# Patient Record
Sex: Male | Born: 1960 | Race: Black or African American | Hispanic: No | Marital: Single | Smoking: Former smoker
Health system: Southern US, Community
[De-identification: ages and names within clinical notes are randomized; demographics above are authoritative.]

## PROBLEM LIST (undated history)

## (undated) DIAGNOSIS — I10 Essential (primary) hypertension: Secondary | ICD-10-CM

## (undated) HISTORY — PX: BACK SURGERY: SHX140

---

## 2005-06-06 ENCOUNTER — Ambulatory Visit: Payer: Self-pay | Admitting: Internal Medicine

## 2007-05-18 ENCOUNTER — Emergency Department: Payer: Self-pay | Admitting: Emergency Medicine

## 2007-05-26 ENCOUNTER — Emergency Department: Payer: Self-pay | Admitting: Emergency Medicine

## 2009-11-11 ENCOUNTER — Emergency Department: Payer: Self-pay | Admitting: Emergency Medicine

## 2009-11-19 ENCOUNTER — Emergency Department: Payer: Self-pay | Admitting: Emergency Medicine

## 2010-08-17 ENCOUNTER — Emergency Department: Payer: Self-pay | Admitting: Emergency Medicine

## 2011-12-07 IMAGING — CR DG ABDOMEN 1V
1 series · 2 of 2 positions shown · non-contrast
Comparison: none

REASON FOR EXAM: constipation
COMMENTS:

PROCEDURE:     DXR - DXR KIDNEY URETER BLADDER  - August 17, 2010 [DATE]
RESULT:     There is no previous exam for comparison.
The bowel gas pattern is unremarkable. No radiopaque renal calculi are seen.
The bony structures are unremarkable.

[Series 1: view not recorded · 0.17mm/px · 2 of 2 slices shown]
[im 1/2]
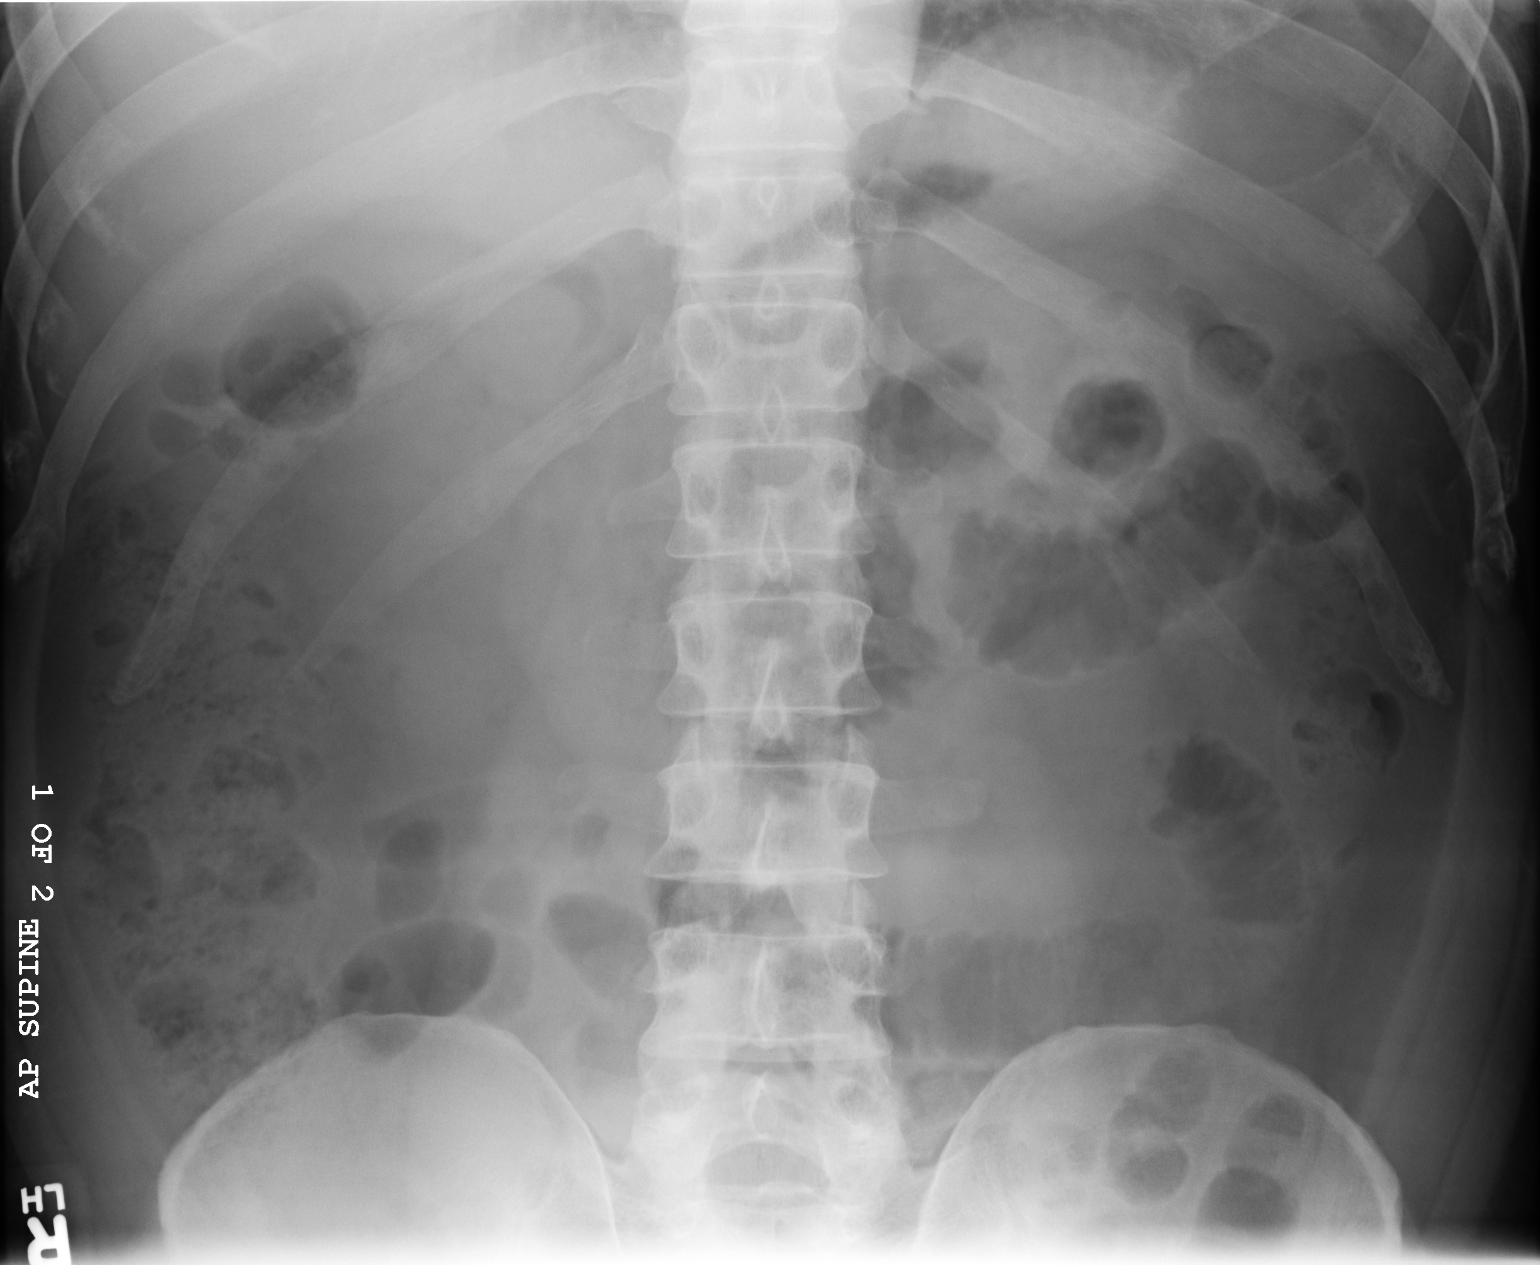
[im 2/2]
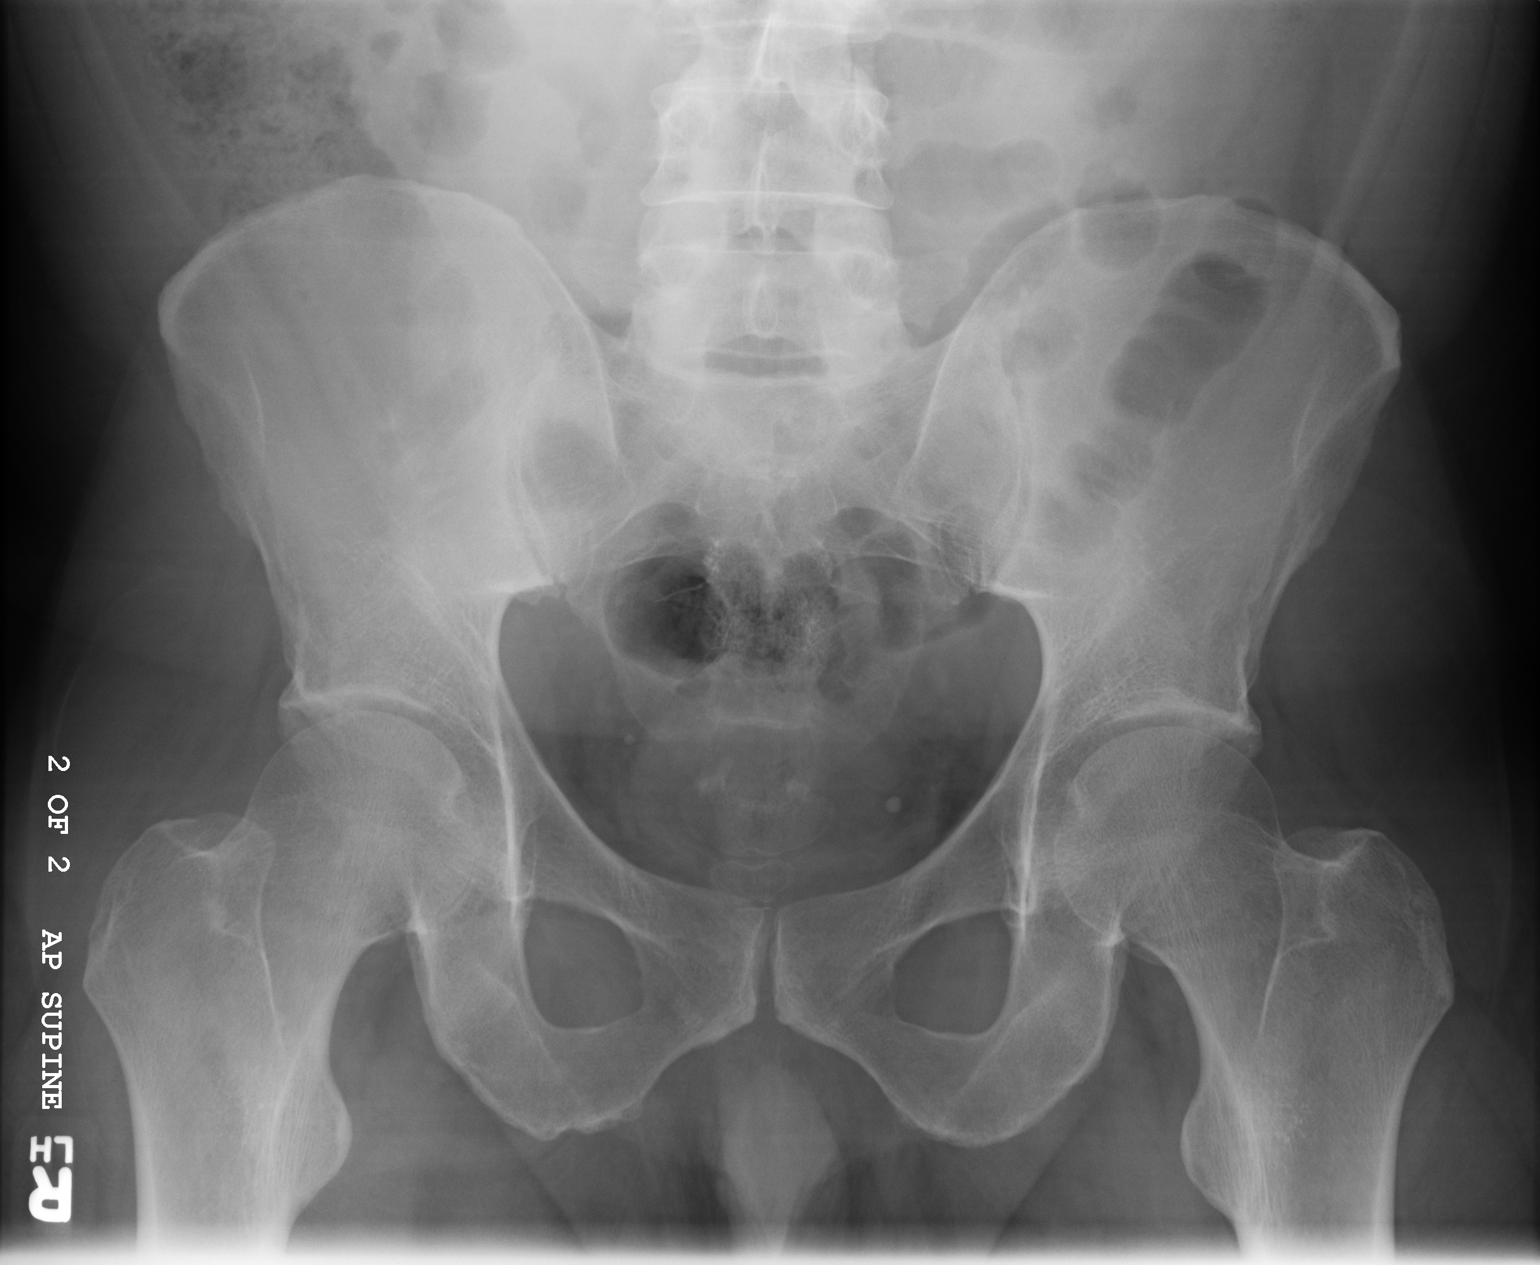

[2 of 2 positions shown; findings below may reference images not displayed]

IMPRESSION: No evidence of bowel obstruction or perforation.

## 2011-12-07 IMAGING — CR DG CHEST 2V
1 series · 2 of 2 positions shown · non-contrast
Comparison: none

REASON FOR EXAM: Shortness of Breath
COMMENTS:   May transport without cardiac monitor

[Series 1: view not recorded · 0.17mm/px · 2 of 2 slices shown]
[im 1/2]
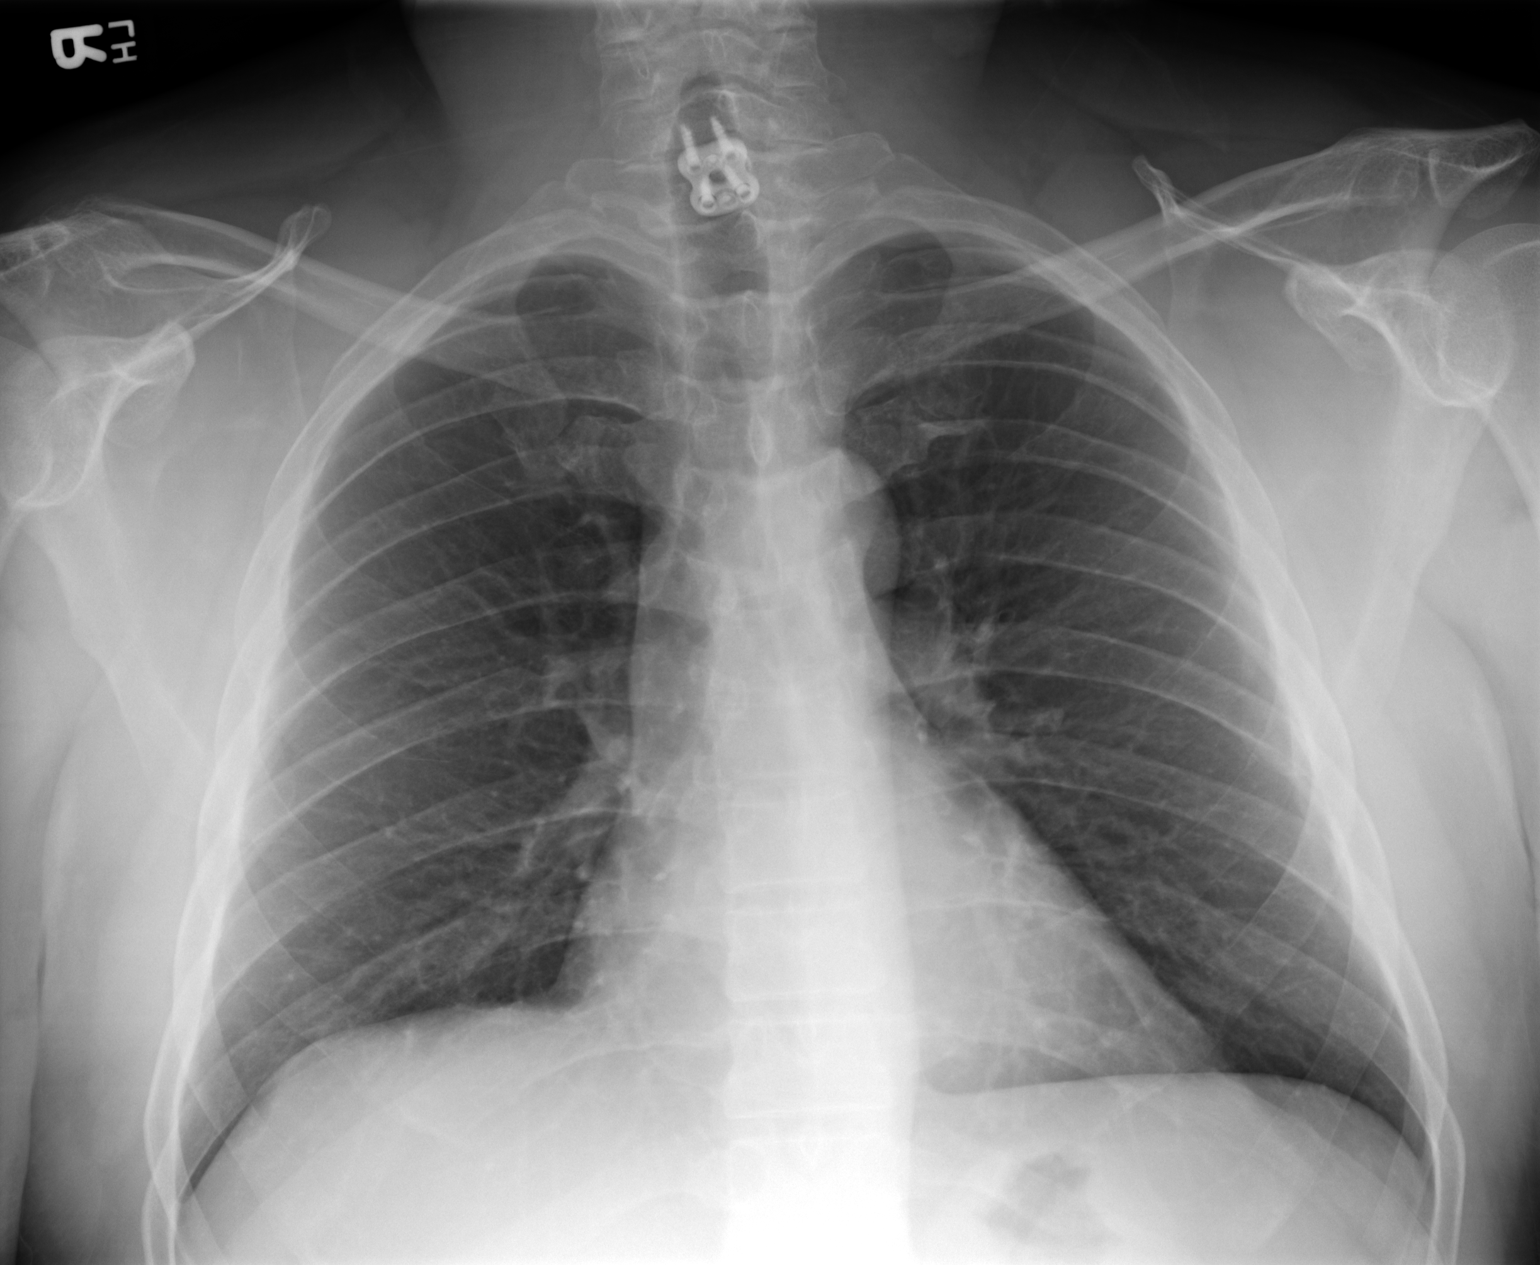
[im 2/2]
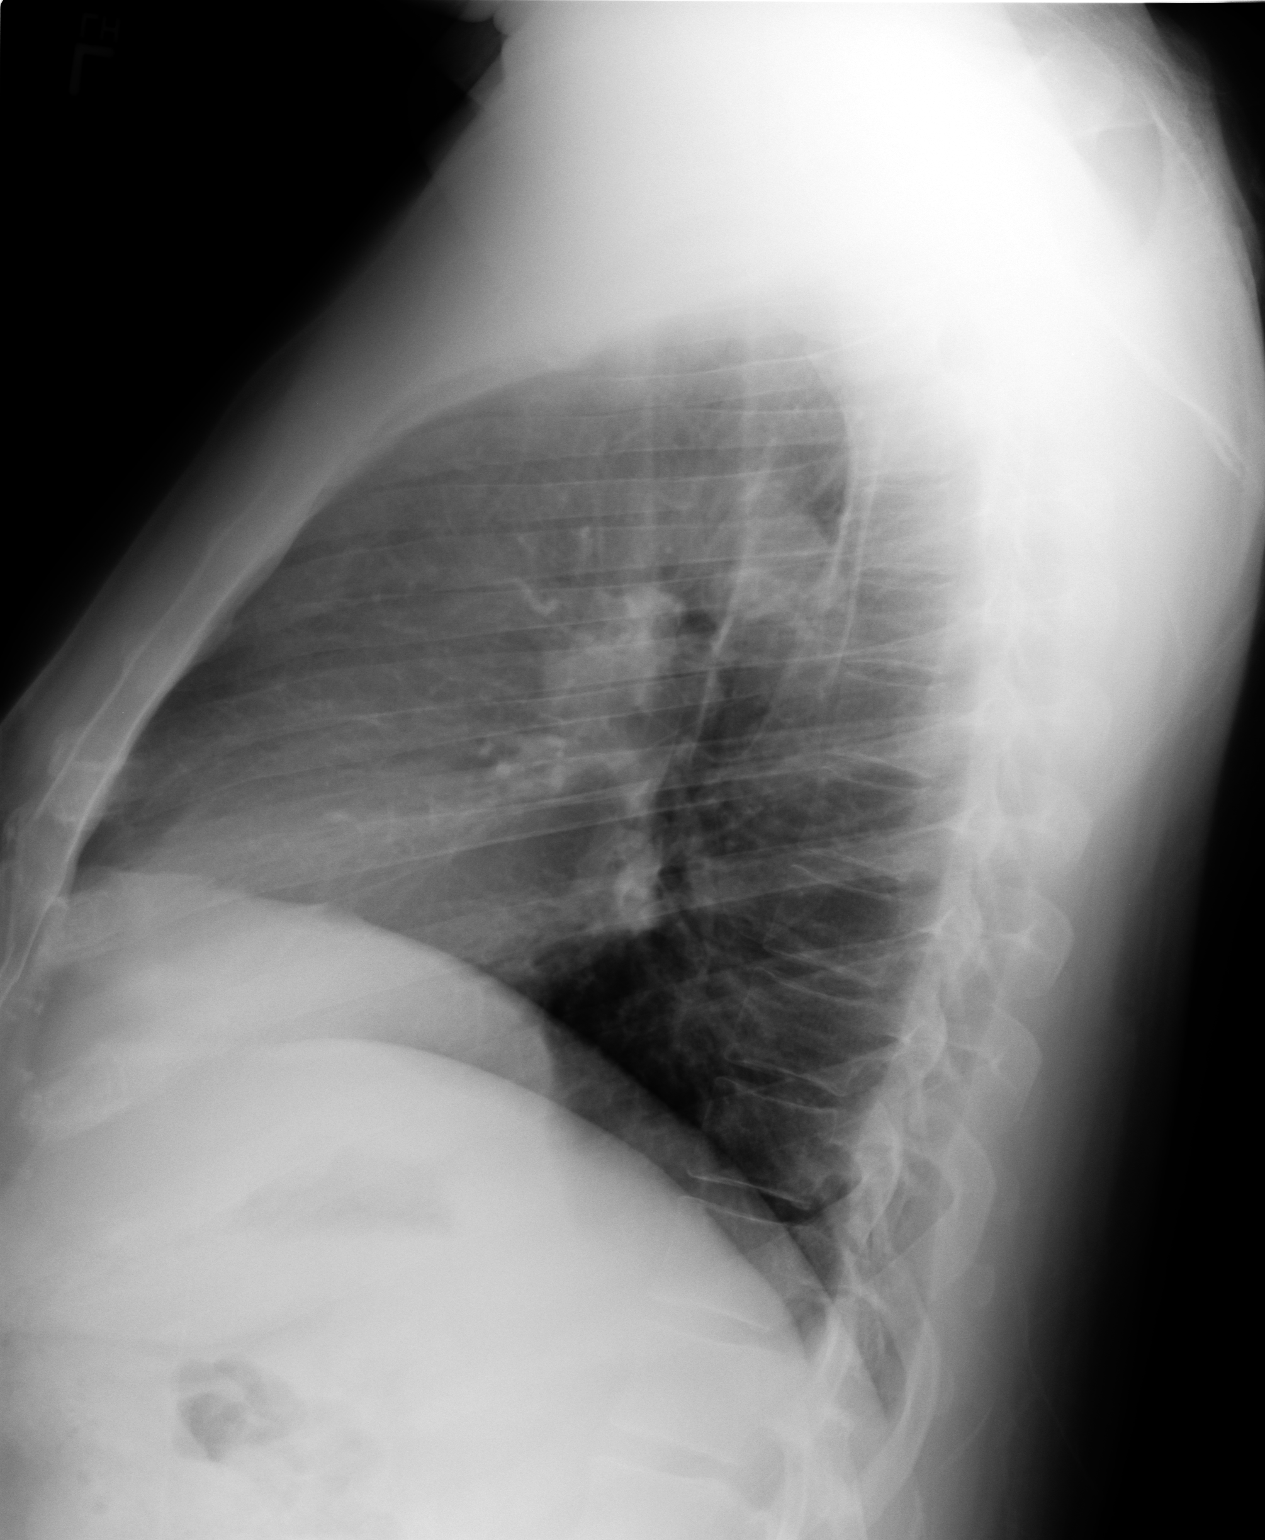

[2 of 2 positions shown; findings below may reference images not displayed]

PROCEDURE:     DXR - DXR CHEST PA (OR AP) AND LATERAL  - August 17, 2010  [DATE]

RESULT:     There is no previous study for comparison.

The lungs are clear. The heart and pulmonary vessels are normal. The bony
and mediastinal structures are unremarkable. There is no effusion. There is
no pneumothorax or evidence of congestive failure.
IMPRESSION: No acute cardiopulmonary disease.

## 2016-06-13 ENCOUNTER — Other Ambulatory Visit: Payer: Self-pay | Admitting: Family Medicine

## 2016-06-13 DIAGNOSIS — M5410 Radiculopathy, site unspecified: Secondary | ICD-10-CM

## 2016-07-02 ENCOUNTER — Ambulatory Visit
Admission: RE | Admit: 2016-07-02 | Discharge: 2016-07-02 | Disposition: A | Discharge status: Home / Self Care | Payer: Self-pay | Source: Ambulatory Visit | Attending: Family Medicine | Admitting: Family Medicine

## 2016-07-02 DIAGNOSIS — M48061 Spinal stenosis, lumbar region without neurogenic claudication: Secondary | ICD-10-CM | POA: Insufficient documentation

## 2016-07-02 DIAGNOSIS — R609 Edema, unspecified: Secondary | ICD-10-CM | POA: Insufficient documentation

## 2016-07-02 DIAGNOSIS — M545 Low back pain: Secondary | ICD-10-CM | POA: Insufficient documentation

## 2016-07-02 DIAGNOSIS — M5410 Radiculopathy, site unspecified: Secondary | ICD-10-CM

## 2016-08-05 ENCOUNTER — Emergency Department
Admission: EM | Admit: 2016-08-05 | Discharge: 2016-08-05 | Disposition: A | Discharge status: Home / Self Care | Payer: Self-pay | Attending: Emergency Medicine | Admitting: Emergency Medicine

## 2016-08-05 ENCOUNTER — Encounter: Payer: Self-pay | Admitting: Emergency Medicine

## 2016-08-05 DIAGNOSIS — I10 Essential (primary) hypertension: Secondary | ICD-10-CM | POA: Insufficient documentation

## 2016-08-05 DIAGNOSIS — L259 Unspecified contact dermatitis, unspecified cause: Secondary | ICD-10-CM | POA: Insufficient documentation

## 2016-08-05 DIAGNOSIS — Z87891 Personal history of nicotine dependence: Secondary | ICD-10-CM | POA: Insufficient documentation

## 2016-08-05 HISTORY — DX: Essential (primary) hypertension: I10

## 2016-08-05 MED ORDER — DIPHENHYDRAMINE HCL 25 MG PO CAPS
50.0000 mg | ORAL_CAPSULE | Freq: Once | ORAL | Status: AC
  Administered 2016-08-05: 50 mg via ORAL
  Filled 2016-08-05: qty 2

## 2016-08-05 MED ORDER — PREDNISONE 20 MG PO TABS: 40.0000 mg | ORAL_TABLET | Freq: Every day | ORAL | 0 refills | Status: AC

## 2016-08-05 MED ORDER — PREDNISONE 20 MG PO TABS
60.0000 mg | ORAL_TABLET | Freq: Once | ORAL | Status: AC
  Administered 2016-08-05: 60 mg via ORAL
  Filled 2016-08-05: qty 3

## 2016-08-05 NOTE — ED Provider Notes (Signed)
[facility] Emergency Department Provider Note   ____________________________________________   I have reviewed the triage vital signs and the nursing notes.   HISTORY  Chief Complaint Facial Swelling   History limited by: Not Limited   HPI Carlos Maynard is a 55 y.o. male who presents to the emergency department today because of concerns for facial swelling. This started last night. The patient states that he had put in just for men hair product in his beard. Shortly afterwards he started noticing swelling discomfort to the beard area. He shaved his beard and has been trying rubbing alcohol. He feels like it is swollen. He states he had a similar episode occur when he tried just for men hair product on his scalp a few years ago.   Past Medical History:  Diagnosis Date  . Hypertension     There are no active problems to display for this patient.   Past Surgical History:  Procedure Laterality Date  . BACK SURGERY      Prior to Admission medications   Not on File    Allergies Patient has no known allergies.  No family history on file.  Social History Social History  Substance Use Topics  . Smoking status: Former Games developermoker  . Smokeless tobacco: Not on file  . Alcohol use Not on file    Review of Systems  Constitutional: Negative for fever. Cardiovascular: Negative for chest pain. Respiratory: Negative for shortness of breath. Gastrointestinal: Negative for abdominal pain, vomiting and diarrhea. Genitourinary: Negative for dysuria. Musculoskeletal: Negative for back pain. Skin: Positive for swelling, discomfort to beard area Neurological: Negative for headaches, focal weakness or numbness.  10-point ROS otherwise negative.  ____________________________________________   PHYSICAL EXAM:  VITAL SIGNS: ED Triage Vitals  Enc Vitals Group     BP 08/05/16 1412 (!) 156/74     Pulse Rate 08/05/16 1412 72     Resp 08/05/16 1412 18   Temp 08/05/16 1412 98.2 F (36.8 C)     Temp Source 08/05/16 1412 Oral     SpO2 08/05/16 1412 97 %     Weight 08/05/16 1415 250 lb (113.4 kg)     Height 08/05/16 1415 6\' 1"  (1.854 m)     Head Circumference --      Peak Flow --      Pain Score --      Pain Loc --      Pain Edu? --      Excl. in GC? --    Constitutional: Alert and oriented. Well appearing and in no distress. Eyes: Conjunctivae are normal. Normal extraocular movements. ENT   Head: Normocephalic   Nose: No congestion/rhinnorhea.   Mouth/Throat: Mucous membranes are moist. No tongue swelling. Submandibular space soft.   Neck: No stridor. Hematological/Lymphatic/Immunilogical: No cervical lymphadenopathy. Cardiovascular: Normal rate, regular rhythm.  No murmurs, rubs, or gallops.  Respiratory: Normal respiratory effort without tachypnea nor retractions. Breath sounds are clear and equal bilaterally. No wheezes/rales/rhonchi. Musculoskeletal: Normal range of motion in all extremities. No lower extremity edema. Neurologic:  Normal speech and language. No gross focal neurologic deficits are appreciated.  Skin:  Beard area of face with some swelling, mild erythema Psychiatric: Mood and affect are normal. Speech and behavior are normal. Patient exhibits appropriate insight and judgment.  ____________________________________________    LABS (pertinent positives/negatives)  None  ____________________________________________   EKG  None  ____________________________________________    RADIOLOGY  None  ____________________________________________   PROCEDURES  Procedures  ____________________________________________   INITIAL  IMPRESSION / ASSESSMENT AND PLAN / ED COURSE  Pertinent labs & imaging results that were available during my care of the patient were reviewed by me and considered in my medical decision making (see chart for details).  Patient presents to the emergency department  today because of concerns for swelling around his beard ear after using just for men hair products. It sounds like patient had a similar reaction a couple of years ago when he isn't on the skull. This point I do think it is likely contact dermatitis secondary to the just for men hair product. Will plan on discharging with steroids and instructed patient on using Benadryl.  ____________________________________________   FINAL CLINICAL IMPRESSION(S) / ED DIAGNOSES  Final diagnoses:  Contact dermatitis, unspecified contact dermatitis type, unspecified trigger     Note: This dictation was prepared with Dragon dictation. Any transcriptional errors that result from this process are unintentional     Phineas SemenGraydon Barbette Mcglaun, MD 08/05/16 1456

## 2016-08-05 NOTE — ED Notes (Signed)
AAOx3.  Skin warm and dry.  NAD 

## 2016-08-05 NOTE — Discharge Instructions (Signed)
Please seek medical attention for any high fevers, chest pain, shortness of breath, change in behavior, persistent vomiting, bloody stool or any other new or concerning symptoms.  

## 2016-08-05 NOTE — ED Triage Notes (Signed)
States yesterday he put Just for Men hair color on beard. After face began to swell. Shaved beard off. Facial swelling noted. States he has been putting rubbing alcohol on it to treat it.

## 2017-10-22 IMAGING — MR MR LUMBAR SPINE W/O CM
4 of 5 series · 15 of 48 positions shown · non-contrast
Comparison: None.

CLINICAL DATA: 55-year-old male with acute low back pain with
radicular symptoms less than 6 weeks. Initial encounter.

EXAM:
MRI LUMBAR SPINE WITHOUT CONTRAST
TECHNIQUE: Multiplanar, multisequence MR imaging of the lumbar spine was
performed. No intravenous contrast was administered.

[Series 2: T2 · sagittal · 4.0mm · 0.44mm/px · 6 of 17 slices shown (1 of 2)]
[im 1/17]
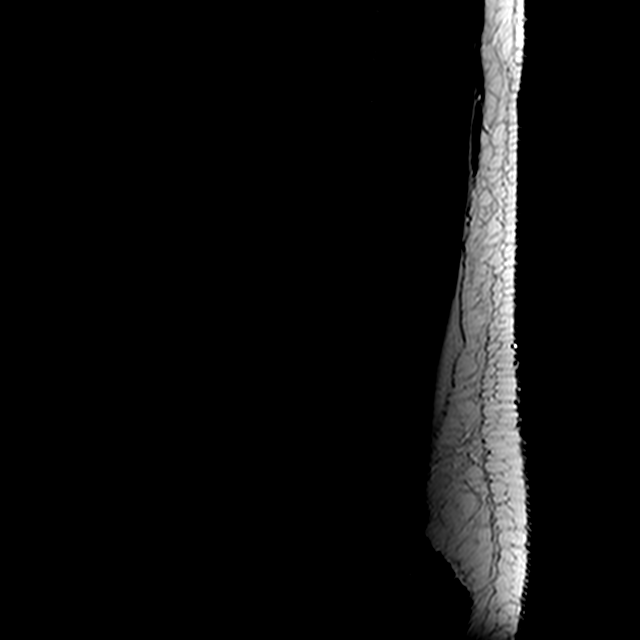
[im 4/17]
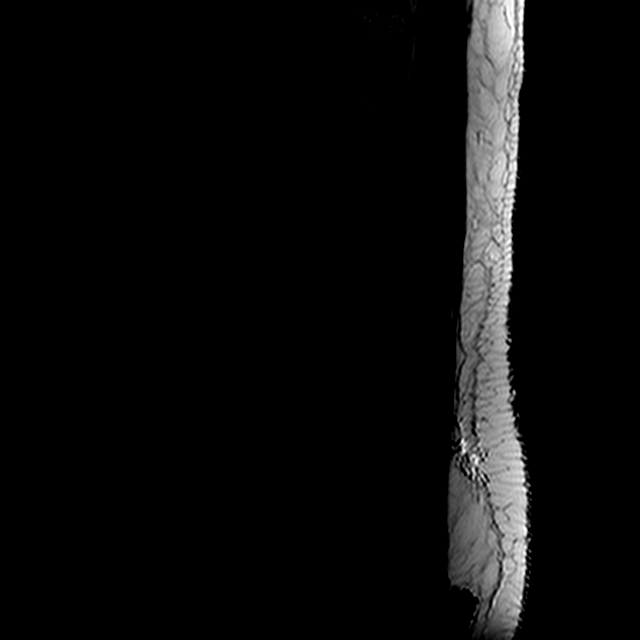
[im 7/17]
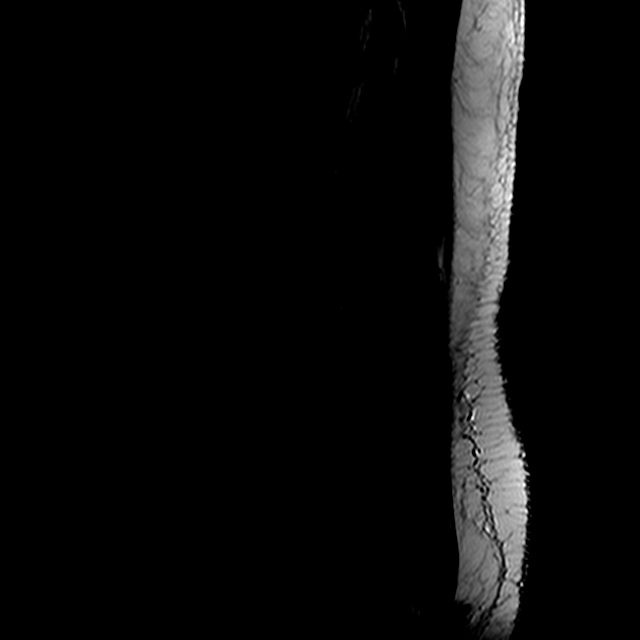
[im 10/17]
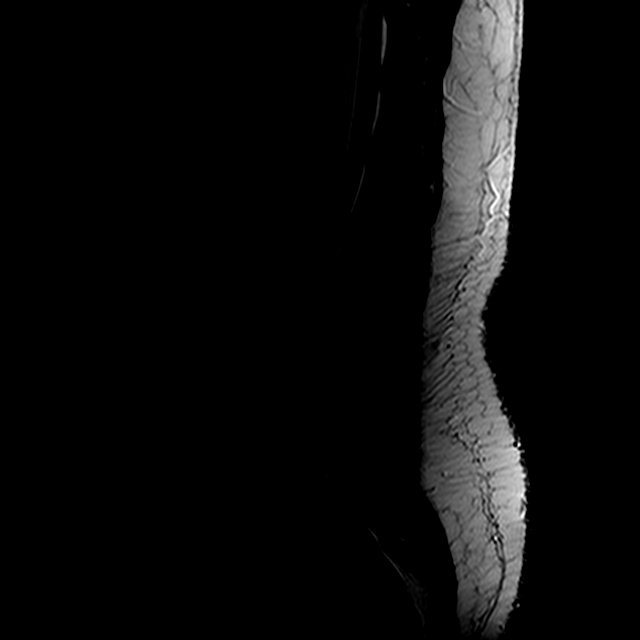
[im 13/17]
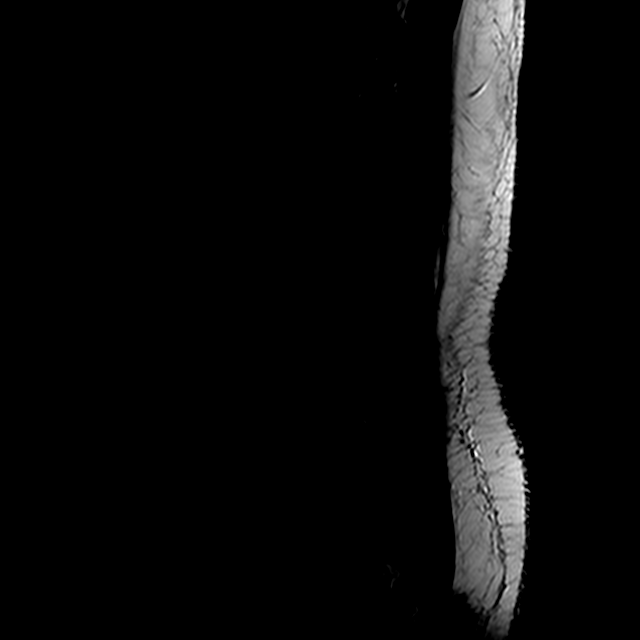
[im 17/17]
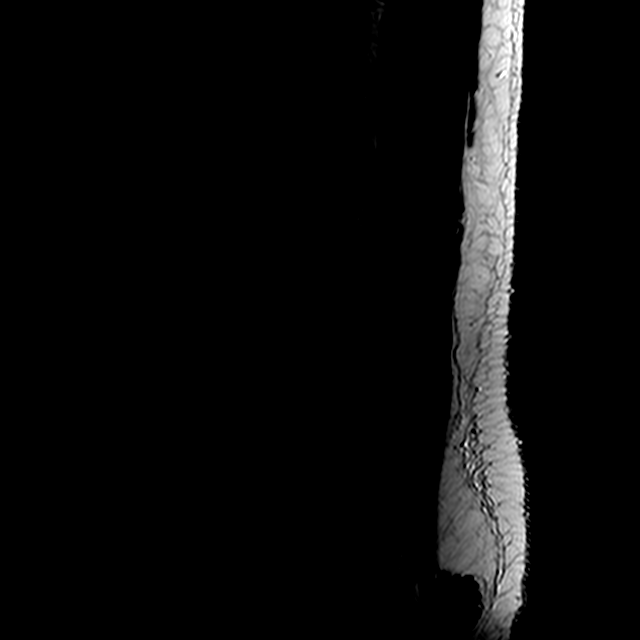

[Series 3: T1 · sagittal · 4.0mm · 0.44mm/px · 3 of 17 slices shown (1 of 2)]
[im 4/17]
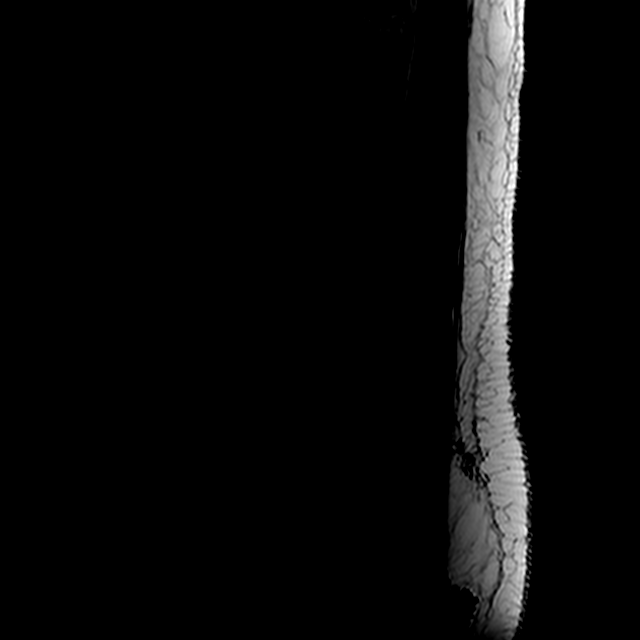
[im 10/17]
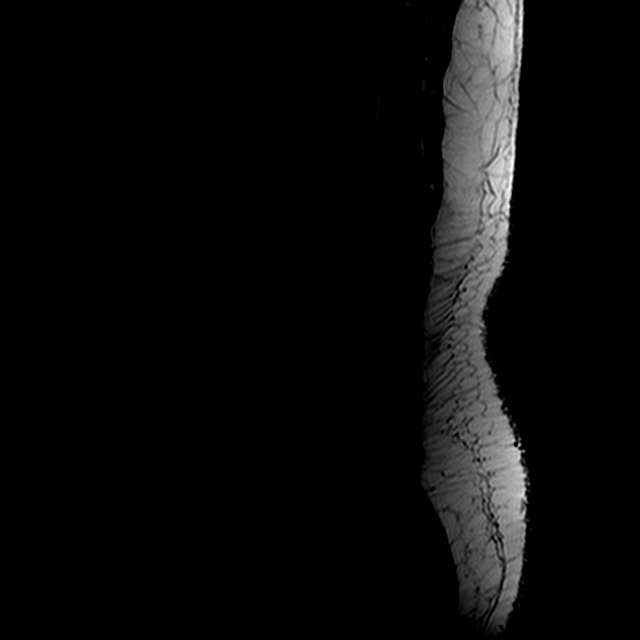
[im 17/17]
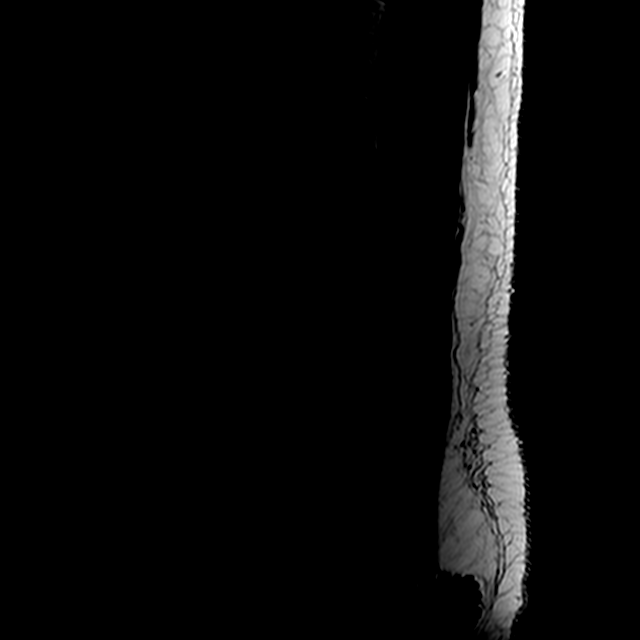

[Series 5: T2 · axial · 4.0mm · 0.39mm/px · z∈[-154,+23]mm · 3 of 42 slices shown (2 of 2)]
[im 6/42]
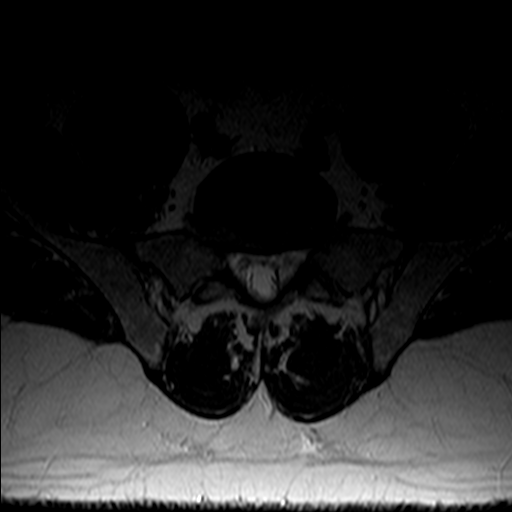
[im 21/42]
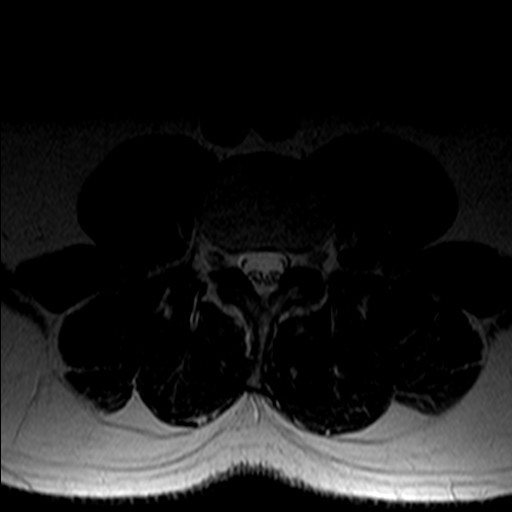
[im 36/42]
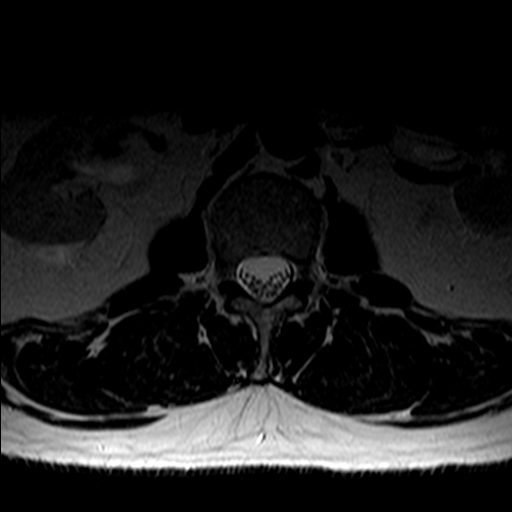

[Series 6: T1 · axial · 4.0mm · 0.39mm/px · z∈[-154,+23]mm · 3 of 42 slices shown (2 of 2)]
[im 6/42]
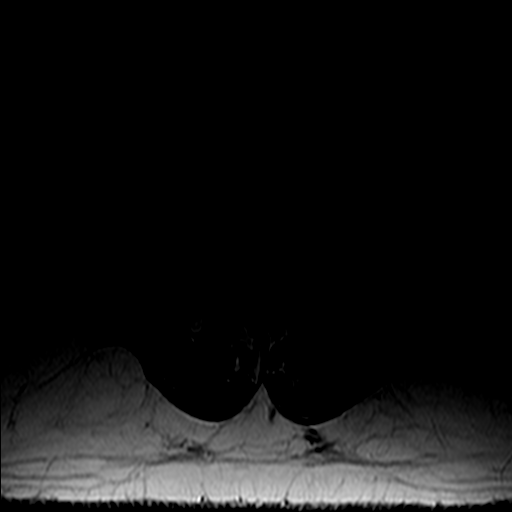
[im 21/42]
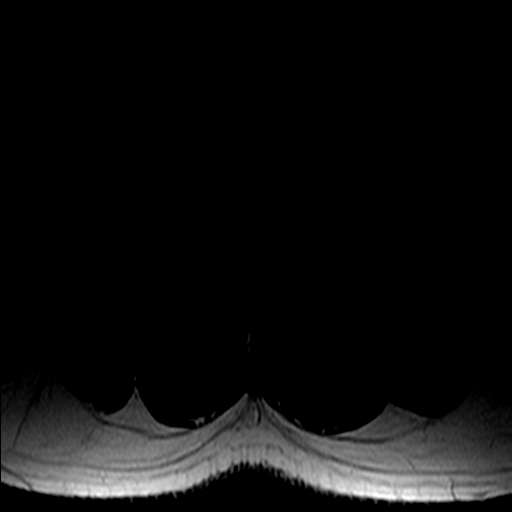
[im 36/42]
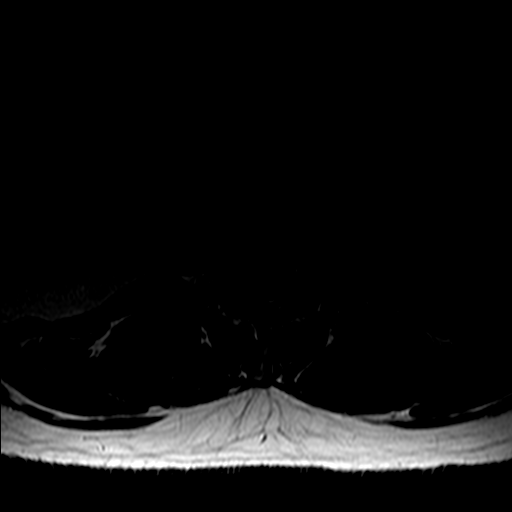

[15 of 48 positions shown; findings below may reference images not displayed]

FINDINGS: Segmentation: Last fully open disk space is labeled L5-S1. Present
examination incorporates from T11-12 disc space through the lower
sacrum.

Alignment:  Minimal curvature.

Vertebrae: Right L4-5 facet degenerative changes with reactive edema
extending into the right pedicle. Small focal fatty deposit right
L3.

Conus medullaris: Extends to the just below the T12-L1 disc space
level and appears normal.

Paraspinal and other soft tissues: Probable sub cm right renal cyst.
No worrisome abnormality noted

Disc levels:

T11-12: Negative.

T12-L1:  Negative.

L1-2:  Negative.

L2-3:  Minimal bulge.

L3-4:  Mild facet bony overgrowth.

L4-5: Facet degenerative greater on the right with reactive bone
edema extending into the right L4 pedicle. Mild ligamentum flavum
hypertrophy. Bulge. Short pedicles. Dorsal epidural fat. Mild thecal
sac narrowing. Mild foraminal narrowing slightly greater on right.

L5-S1: Mild bulge. Mild facet degenerative changes. Mild foraminal
narrowing greater on the left. No significant spinal stenosis.
IMPRESSION: L4-5 facet degenerative greater on the right with reactive bone
edema extending into the right L4 pedicle. Multifactorial mild
thecal sac narrowing. Mild foraminal narrowing slightly greater on
right.

L5-S1 multifactorial mild foraminal narrowing greater on the left.
No significant spinal stenosis.

## 2019-12-22 ENCOUNTER — Ambulatory Visit: Payer: Self-pay | Attending: Internal Medicine

## 2019-12-22 ENCOUNTER — Ambulatory Visit: Payer: Self-pay

## 2019-12-22 DIAGNOSIS — Z23 Encounter for immunization: Secondary | ICD-10-CM

## 2019-12-22 NOTE — Progress Notes (Signed)
   Covid-19 Vaccination Clinic  Name:  CORDERIUS SARACENI    MRN: [id number] DOB: 22-Aug-1960  12/22/2019  Mr. Battle was observed post Covid-19 immunization for 15 minutes without incident. He was provided with Vaccine Information Sheet and instruction to access the V-Safe system.   Mr. Duque was instructed to call 911 with any severe reactions post vaccine: Marland Kitchen Difficulty breathing  . Swelling of face and throat  . A fast heartbeat  . A bad rash all over body  . Dizziness and weakness   Immunizations Administered    Name Date Dose VIS Date Route   Moderna COVID-19 Vaccine 12/22/2019  2:57 PM 0.5 mL 07/2019 Intramuscular   Manufacturer: Moderna   Lot: [id number]   NDC: 61470-929-57

## 2020-01-19 ENCOUNTER — Ambulatory Visit: Payer: Self-pay

## 2020-01-19 DIAGNOSIS — Z23 Encounter for immunization: Secondary | ICD-10-CM

## 2020-01-19 NOTE — Progress Notes (Signed)
   Covid-19 Vaccination Clinic  Name:  Carlos Maynard    MRN: [id number] DOB: 31-Oct-1960  01/19/2020  Mr. Holford was observed post Covid-19 immunization for 15 minutes without incident. He was provided with Vaccine Information Sheet and instruction to access the V-Safe system.   Mr. Grayer was instructed to call 911 with any severe reactions post vaccine: Marland Kitchen Difficulty breathing  . Swelling of face and throat  . A fast heartbeat  . A bad rash all over body  . Dizziness and weakness   Immunizations Administered    Name Date Dose VIS Date Route   Moderna COVID-19 Vaccine 01/19/2020  3:28 PM 0.5 mL 07/2019 Intramuscular   Manufacturer: Moderna   Lot: [id number]   NDC: 84536-468-03

## 2021-09-25 DIAGNOSIS — R69 Illness, unspecified: Secondary | ICD-10-CM | POA: Diagnosis not present

## 2021-09-25 DIAGNOSIS — Z122 Encounter for screening for malignant neoplasm of respiratory organs: Secondary | ICD-10-CM | POA: Diagnosis not present

## 2021-09-25 DIAGNOSIS — E782 Mixed hyperlipidemia: Secondary | ICD-10-CM | POA: Diagnosis not present

## 2021-09-25 DIAGNOSIS — Z0000 Encounter for general adult medical examination without abnormal findings: Secondary | ICD-10-CM | POA: Diagnosis not present

## 2021-09-25 DIAGNOSIS — I10 Essential (primary) hypertension: Secondary | ICD-10-CM | POA: Diagnosis not present

## 2021-09-25 DIAGNOSIS — Z23 Encounter for immunization: Secondary | ICD-10-CM | POA: Diagnosis not present

## 2021-09-25 DIAGNOSIS — R972 Elevated prostate specific antigen [PSA]: Secondary | ICD-10-CM | POA: Diagnosis not present

## 2021-09-25 DIAGNOSIS — Z125 Encounter for screening for malignant neoplasm of prostate: Secondary | ICD-10-CM | POA: Diagnosis not present

## 2021-10-01 DIAGNOSIS — I10 Essential (primary) hypertension: Secondary | ICD-10-CM | POA: Diagnosis not present

## 2021-10-01 DIAGNOSIS — R972 Elevated prostate specific antigen [PSA]: Secondary | ICD-10-CM | POA: Diagnosis not present

## 2021-10-01 DIAGNOSIS — Z125 Encounter for screening for malignant neoplasm of prostate: Secondary | ICD-10-CM | POA: Diagnosis not present

## 2021-10-01 DIAGNOSIS — R5383 Other fatigue: Secondary | ICD-10-CM | POA: Diagnosis not present

## 2021-10-01 DIAGNOSIS — R5381 Other malaise: Secondary | ICD-10-CM | POA: Diagnosis not present

## 2021-10-18 DIAGNOSIS — Z122 Encounter for screening for malignant neoplasm of respiratory organs: Secondary | ICD-10-CM | POA: Diagnosis not present

## 2021-10-18 DIAGNOSIS — R69 Illness, unspecified: Secondary | ICD-10-CM | POA: Diagnosis not present

## 2021-10-18 DIAGNOSIS — J432 Centrilobular emphysema: Secondary | ICD-10-CM | POA: Diagnosis not present

## 2021-10-18 DIAGNOSIS — R918 Other nonspecific abnormal finding of lung field: Secondary | ICD-10-CM | POA: Diagnosis not present

## 2021-11-05 DIAGNOSIS — R69 Illness, unspecified: Secondary | ICD-10-CM | POA: Diagnosis not present

## 2021-11-05 DIAGNOSIS — R739 Hyperglycemia, unspecified: Secondary | ICD-10-CM | POA: Diagnosis not present

## 2021-11-05 DIAGNOSIS — R972 Elevated prostate specific antigen [PSA]: Secondary | ICD-10-CM | POA: Diagnosis not present

## 2021-11-05 DIAGNOSIS — Z1331 Encounter for screening for depression: Secondary | ICD-10-CM | POA: Diagnosis not present

## 2021-11-05 DIAGNOSIS — Z1330 Encounter for screening examination for mental health and behavioral disorders, unspecified: Secondary | ICD-10-CM | POA: Diagnosis not present

## 2021-11-05 DIAGNOSIS — R918 Other nonspecific abnormal finding of lung field: Secondary | ICD-10-CM | POA: Diagnosis not present

## 2022-01-16 DIAGNOSIS — E119 Type 2 diabetes mellitus without complications: Secondary | ICD-10-CM | POA: Diagnosis not present

## 2022-01-16 DIAGNOSIS — R69 Illness, unspecified: Secondary | ICD-10-CM | POA: Diagnosis not present

## 2022-01-16 DIAGNOSIS — N529 Male erectile dysfunction, unspecified: Secondary | ICD-10-CM | POA: Diagnosis not present
